# Patient Record
Sex: Female | Born: 1960 | Race: White | Hispanic: No | Marital: Married | Smoking: Former smoker
Health system: Southern US, Community
[De-identification: ages and names within clinical notes are randomized; demographics above are authoritative.]

## PROBLEM LIST (undated history)

## (undated) DIAGNOSIS — R42 Dizziness and giddiness: Secondary | ICD-10-CM

## (undated) DIAGNOSIS — R002 Palpitations: Secondary | ICD-10-CM

## (undated) HISTORY — PX: TUBAL LIGATION: SHX77

## (undated) HISTORY — PX: ENDOMETRIAL ABLATION: SHX621

---
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
## 2019-05-03 ENCOUNTER — Ambulatory Visit (INDEPENDENT_AMBULATORY_CARE_PROVIDER_SITE_OTHER): Payer: BC Managed Care – PPO | Admitting: Cardiology

## 2019-05-03 ENCOUNTER — Other Ambulatory Visit: Payer: Self-pay

## 2019-05-03 VITALS — BP 142/84 | HR 77 | Ht 62.0 in | Wt 164.0 lb

## 2019-05-03 DIAGNOSIS — R011 Cardiac murmur, unspecified: Secondary | ICD-10-CM | POA: Insufficient documentation

## 2019-05-03 DIAGNOSIS — R002 Palpitations: Secondary | ICD-10-CM | POA: Insufficient documentation

## 2019-05-03 DIAGNOSIS — R072 Precordial pain: Secondary | ICD-10-CM

## 2019-05-03 DIAGNOSIS — R202 Paresthesia of skin: Secondary | ICD-10-CM | POA: Insufficient documentation

## 2019-05-03 DIAGNOSIS — R0789 Other chest pain: Secondary | ICD-10-CM

## 2019-05-03 DIAGNOSIS — Z01812 Encounter for preprocedural laboratory examination: Secondary | ICD-10-CM | POA: Insufficient documentation

## 2019-05-03 DIAGNOSIS — R42 Dizziness and giddiness: Secondary | ICD-10-CM | POA: Insufficient documentation

## 2019-05-03 DIAGNOSIS — R079 Chest pain, unspecified: Secondary | ICD-10-CM

## 2019-05-03 HISTORY — DX: Palpitations: R00.2

## 2019-05-03 HISTORY — DX: Dizziness and giddiness: R42

## 2019-05-03 MED ORDER — NITROGLYCERIN 0.4 MG SL SUBL: 0.4000 mg | SUBLINGUAL_TABLET | SUBLINGUAL | 5 refills | Status: DC | PRN

## 2019-05-03 MED ORDER — METOPROLOL TARTRATE 50 MG PO TABS: ORAL_TABLET | ORAL | 0 refills | Status: DC

## 2019-05-03 NOTE — Progress Notes (Signed)
Cardiology Office Note:    Date:  05/03/2019   ID:  Alexis Roy, DOB 05-Jan-1961, MRN [id number]  PCP:  Enid Skeens., MD  Cardiologist:  No primary care provider on file.  Electrophysiologist:  None   Referring MD: Enid Skeens., MD   The patient was referred by her primary care physician for chest tightness and palpitations.  ASSESSMENT:    1. Chest pain of uncertain etiology   2. Murmur   3. Precordial pain   4. Palpitations   5. Pre-procedure lab exam    PLAN:    1.  Although her chest pain is sounds atypical but she has risk factors therefore making her intermediate risk patient for coronary artery disease.  I am going to pursue an ischemic evaluation with a CTA of the coronaries.  The patient is not allergic to contrast dye and she is willing to proceed with this diagnostic testing. All of her questions were answered during this time to her satisfaction. Sublingual nitroglycerin prescription was sent, its protocol and 911 protocol explained and the patient vocalized understanding questions were answered to the patient's satisfaction.  2.  Zio patch for 3 days will be ordered for patient for me to be able to assess any heart rate scratching or rhythm changes.  3.  Transthoracic echocardiogram has been ordered to assess  RV/LV function, structural abnormalities.  4.  Her blood pressure is slightly elevated in office today with systolic [id number] she denies any history of hypertension states that whenever her blood pressure is taking is usually around 110.  I have advised patient to decrease salt intake in her diet as well as we are going to reassess need for starting antihypertensives if this continues to increase.  5.  Lab work from her PCP office shows recent lipid profile- this was reviewed with the patient.   The patient is in agreement with the above plan. The patient left the office in stable condition.  The patient will follow up in 2 months.   Medication  Adjustments/Labs and Tests Ordered: Current medicines are reviewed at length with the patient today.  Concerns regarding medicines are outlined above.  Orders Placed This Encounter  Procedures  . CT CORONARY FRACTIONAL FLOW RESERVE DATA PREP  . CT CORONARY FRACTIONAL FLOW RESERVE FLUID ANALYSIS  . CT CORONARY MORPH W/CTA COR W/SCORE W/CA W/CM &/OR WO/CM  . Basic Metabolic Panel (BMET)  . LONG TERM MONITOR (3-14 DAYS)  . EKG 12-Lead  . ECHOCARDIOGRAM COMPLETE   Meds ordered this encounter  Medications  . nitroGLYCERIN (NITROSTAT) 0.4 MG SL tablet    Sig: Place 1 tablet (0.4 mg total) under the tongue every 5 (five) minutes as needed for chest pain.    Dispense:  25 tablet    Refill:  5  . metoprolol tartrate (LOPRESSOR) 50 MG tablet    Sig: Take 2 tabs(100 mg) 2 hours prior to CT    Dispense:  2 tablet    Refill:  0    History of Present Illness:    Alexis Roy is a 58 y.o. female with a hx of significant family history of premature coronary artery disease.  The patient Roy to be evaluated because she is followed by several months of chest tightness.  She describes this sensation at the upper portion her chest with tightness that goes to her shoulder into her neck.  She notes that usually last for about a few minutes with spontaneous resolution.  She admits to a brief period  of shortness of breath.   In addition she tells me she has been experiencing intermittent palpitation.  She describes this as a rapid onset which last sometimes for 2 minutes and have abrupt offset.  She states that this can happen anytime during the day or night without any ramp-up.  She notes that recently she noticed that if she is experiencing this and cough this helps relieve her as the rapid heart rate stops.  She admits to some lightheadedness during these episodes.  She is not experiencing any chest pain at this time.  But notes that she had the episode of palpitations prior to presenting.  Past  Medical History:  Diagnosis Date  . Dizziness 05/03/2019  . Palpitations 05/03/2019    Past Surgical History:  Procedure Laterality Date  . ENDOMETRIAL ABLATION    . TUBAL LIGATION      Current Medications: Current Meds  Medication Sig  . ibuprofen (ADVIL) 200 MG tablet Take 200 mg by mouth every 6 (six) hours as needed.     Allergies:   Patient has no known allergies.   Social History   Socioeconomic History  . Marital status: Married    Spouse name: Not on file  . Number of children: Not on file  . Years of education: Not on file  . Highest education level: Not on file  Occupational History  . Not on file  Social Needs  . Financial resource strain: Not on file  . Food insecurity    Worry: Not on file    Inability: Not on file  . Transportation needs    Medical: Not on file    Non-medical: Not on file  Tobacco Use  . Smoking status: Former Smoker    Packs/day: 0.50    Years: 10.00    Pack years: 5.00    Types: Cigarettes    Quit date: 1990    Years since quitting: 30.7  . Smokeless tobacco: Never Used  Substance and Sexual Activity  . Alcohol use: Never    Frequency: Never  . Drug use: Never  . Sexual activity: Not on file  Lifestyle  . Physical activity    Days per week: Not on file    Minutes per session: Not on file  . Stress: Not on file  Relationships  . Social Herbalist on phone: Not on file    Gets together: Not on file    Attends religious service: Not on file    Active member of club or organization: Not on file    Attends meetings of clubs or organizations: Not on file    Relationship status: Not on file  Other Topics Concern  . Not on file  Social History Narrative  . Not on file      Family History: The patient's family history includes CAD in her father and mother; Heart attack in her brother and brother; Hypertension in her sister and sister; Stroke in her brother.    ROS:   Review of Systems  Constitution: Negative  for decreased appetite, fever and weight gain.  HENT: Negative for congestion, ear discharge, hoarse voice and sore throat.   Eyes: Negative for discharge, redness, vision loss in right eye and visual halos.  Cardiovascular: Negative for chest pain, dyspnea on exertion, leg swelling, orthopnea and palpitations.  Respiratory: Negative for cough, hemoptysis, shortness of breath and snoring.   Endocrine: Negative for heat intolerance and polyphagia.  Hematologic/Lymphatic: Negative for bleeding problem. Does not bruise/bleed  easily.  Skin: Negative for flushing, nail changes, rash and suspicious lesions.  Musculoskeletal: Negative for arthritis, joint pain, muscle cramps, myalgias, neck pain and stiffness.  Gastrointestinal: Negative for abdominal pain, bowel incontinence, diarrhea and excessive appetite.  Genitourinary: Negative for decreased libido, genital sores and incomplete emptying.  Neurological: Negative for brief paralysis, focal weakness, headaches and loss of balance.  Psychiatric/Behavioral: Negative for altered mental status, depression and suicidal ideas.  Allergic/Immunologic: Negative for HIV exposure and persistent infections.    EKGs/Labs/Other Studies Reviewed:    The following studies were reviewed today:   EKG:  The ekg ordered today demonstrates sinus rhythm, heart rate 77 bpm nonspecific ST changes.  Recent Labs: No results found for requested labs within last 8760 hours.  Recent Lipid Panel No results found for: CHOL, TRIG, HDL, CHOLHDL, VLDL, LDLCALC, LDLDIRECT  Physical Exam:    VS:  BP (!) 142/84 (BP Location: Right Arm, Patient Position: Sitting, Cuff Size: Normal)   Pulse 77   Ht 5\' 2"  (1.575 m)   Wt 164 lb (74.4 kg)   SpO2 98%   BMI 30.00 kg/m     Wt Readings from Last 3 Encounters:  05/03/19 164 lb (74.4 kg)     GEN: Well nourished, well developed in no acute distress HEENT: Normal NECK: No JVD; No carotid bruits LYMPHATICS: No lymphadenopathy  CARDIAC: S1S2 noted,RRR, 2/6 holosystolic murmurs, rubs, gallops RESPIRATORY:  Clear to auscultation without rales, wheezing or rhonchi  ABDOMEN: Soft, non-tender, non-distended, +bowel sounds, no guarding. EXTREMITIES: No edema, No cyanosis, no clubbing MUSCULOSKELETAL:  No edema; No deformity  SKIN: Warm and dry NEUROLOGIC:  Alert and oriented x 3, non-focal PSYCHIATRIC:  Normal affect, good insight   Patient Instructions  Medication Instructions:  Your physician has recommended you make the following change in your medication:   Nitroglycerin 0.4 mg sublingual (under your tongue) as needed for chest pain. If experiencing chest pain, stop what you are doing and sit down. Take 1 nitroglycerin and wait 5 minutes. If chest pain continues, take another nitroglycerin and wait 5 minutes. If chest pain does not subside, take 1 more nitroglycerin and dial 911. You make take a total of 3 nitroglycerin in a 15 minute time frame.    If you need a refill on your cardiac medications before your next appointment, please call your pharmacy.   Lab work: Your physician recommends that you return for lab work in:   3-7 days prior to CT:BMP  If you have labs (blood work) drawn today and your tests are completely normal, you will receive your results only by: Marland Kitchen MyChart Message (if you have MyChart) OR . A paper copy in the mail If you have any lab test that is abnormal or we need to change your treatment, we will call you to review the results.  Testing/Procedures: Your physician has requested that you have an echocardiogram. Echocardiography is a painless test that uses sound waves to create images of your heart. It provides your doctor with information about the size and shape of your heart and how well your heart's chambers and valves are working. This procedure takes approximately one hour. There are no restrictions for this procedure.  Your physician has recommended that you wear a ZIO monitor.  ZIO monitors are medical devices that record the heart's electrical activity. Doctors most often use these monitors to diagnose arrhythmias. Arrhythmias are problems with the speed or rhythm of the heartbeat. The monitor is a small, portable device. You can wear  one while you do your normal daily activities. This is usually used to diagnose what is causing palpitations/syncope (passing out).  Wear 3 days   Your physician has requested that you have cardiac CT. Cardiac computed tomography (CT) is a painless test that uses an x-ray machine to take clear, detailed pictures of your heart. For further information please visit [email or web address]. Please follow instruction sheet as given.  Your cardiac CT will be scheduled at one of the below locations:   [facility] [address] [place], [place] [address] [phone]  If scheduled at [facility], please arrive at the [facility] main entrance of [facility] 30-45 minutes prior to test start time. Proceed to the [facility] Radiology Department (first floor) to check-in and test prep.  Please follow these instructions carefully (unless otherwise directed):  On the Night Before the Test: . Be sure to Drink plenty of water. . Do not consume any caffeinated/decaffeinated beverages or chocolate 12 hours prior to your test. . Do not take any antihistamines 12 hours prior to your test.  On the Day of the Test: . Drink plenty of water. Do not drink any water within one hour of the test. . Do not eat any food 4 hours prior to the test. . You may take your regular medications prior to the test.  . Take metoprolol (Lopressor) two hours prior to test. . FEMALES- please wear underwire-free bra if available                 -If HR is less than 55 BPM- No Beta Blocker                -IF HR is greater than 55 BPM and patient is less than or equal to 18 yrs old Lopressor 100mg  x1. .     After the Test: . Drink plenty of  water. . After receiving IV contrast, you may experience a mild flushed feeling. This is normal. . On occasion, you may experience a mild rash up to 24 hours after the test. This is not dangerous. If this occurs, you can take Benadryl 25 mg and increase your fluid intake. . If you experience trouble breathing, this can be serious. If it is severe call 911 IMMEDIATELY. If it is mild, please call our office.   Please contact the cardiac imaging nurse navigator should you have any questions/concerns Marchia Bond, RN Navigator Cardiac Imaging [facility] [phone] Office  [phone] Cell   Follow-Up: At [facility], you and your health needs are our priority.  As part of our continuing mission to provide you with exceptional heart care, we have created designated Provider Care Teams.  These Care Teams include your primary Cardiologist (physician) and Advanced Practice Providers (APPs -  Physician Assistants and Nurse Practitioners) who all work together to provide you with the care you need, when you need it. You will need a follow up appointment in 2 months with Dr Harriet Masson Any Other Special Instructions Will Be Listed Below (If Applicable).   Cardiac CT Angiogram  A cardiac CT angiogram is a procedure to look at the heart and the area around the heart. It may be done to help find the cause of chest pains or other symptoms of heart disease. During this procedure, a large X-ray machine, called a CT scanner, takes detailed pictures of the heart and the surrounding area after a dye (contrast material) has been injected into blood vessels  in the area. The procedure is also sometimes called a coronary CT angiogram, coronary artery scanning, or CTA. A cardiac CT angiogram allows the health care provider to see how well blood is flowing to and from the heart. The health care provider will be able to see if there are any problems, such as:  Blockage or narrowing of the  coronary arteries in the heart.  Fluid around the heart.  Signs of weakness or disease in the muscles, valves, and tissues of the heart. Tell a health care provider about:  Any allergies you have. This is especially important if you have had a previous allergic reaction to contrast dye.  All medicines you are taking, including vitamins, herbs, eye drops, creams, and over-the-counter medicines.  Any blood disorders you have.  Any surgeries you have had.  Any medical conditions you have.  Whether you are pregnant or may be pregnant.  Any anxiety disorders, chronic pain, or other conditions you have that may increase your stress or prevent you from lying still. What are the risks? Generally, this is a safe procedure. However, problems may occur, including:  Bleeding.  Infection.  Allergic reactions to medicines or dyes.  Damage to other structures or organs.  Kidney damage from the dye or contrast that is used.  Increased risk of cancer from radiation exposure. This risk is low. Talk with your health care provider about: ? The risks and benefits of testing. ? How you can receive the lowest dose of radiation. What happens before the procedure?  Wear comfortable clothing and remove any jewelry, glasses, dentures, and hearing aids.  Follow instructions from your health care provider about eating and drinking. This may include: ? For 12 hours before the test - avoid caffeine. This includes tea, coffee, soda, energy drinks, and diet pills. Drink plenty of water or other fluids that do not have caffeine in them. Being well-hydrated can prevent complications. ? For 4-6 hours before the test - stop eating and drinking. The contrast dye can cause nausea, but this is less likely if your stomach is empty.  Ask your health care provider about changing or stopping your regular medicines. This is especially important if you are taking diabetes medicines, blood thinners, or medicines to  treat erectile dysfunction. What happens during the procedure?  Hair on your chest may need to be removed so that small sticky patches called electrodes can be placed on your chest. These will transmit information that helps to monitor your heart during the test.  An IV tube will be inserted into one of your veins.  You might be given a medicine to control your heart rate during the test. This will help to ensure that good images are obtained.  You will be asked to lie on an exam table. This table will slide in and out of the CT machine during the procedure.  Contrast dye will be injected into the IV tube. You might feel warm, or you may get a metallic taste in your mouth.  You will be given a medicine (nitroglycerin) to relax (dilate) the arteries in your heart.  The table that you are lying on will move into the CT machine tunnel for the scan.  The person running the machine will give you instructions while the scans are being done. You may be asked to: ? Keep your arms above your head. ? Hold your breath. ? Stay very still, even if the table is moving.  When the scanning is complete, you will  be moved out of the machine.  The IV tube will be removed. The procedure may vary among health care providers and hospitals. What happens after the procedure?  You might feel warm, or you may get a metallic taste in your mouth from the contrast dye.  You may have a headache from the nitroglycerin.  After the procedure, drink water or other fluids to wash (flush) the contrast material out of your body.  Contact a health care provider if you have any symptoms of allergy to the contrast. These symptoms include: ? Shortness of breath. ? Rash or hives. ? A racing heartbeat.  Most people can return to their normal activities right after the procedure. Ask your health care provider what activities are safe for you.  It is up to you to get the results of your procedure. Ask your health care  provider, or the department that is doing the procedure, when your results will be ready. Summary  A cardiac CT angiogram is a procedure to look at the heart and the area around the heart. It may be done to help find the cause of chest pains or other symptoms of heart disease.  During this procedure, a large X-ray machine, called a CT scanner, takes detailed pictures of the heart and the surrounding area after a dye (contrast material) has been injected into blood vessels in the area.  Ask your health care provider about changing or stopping your regular medicines before the procedure. This is especially important if you are taking diabetes medicines, blood thinners, or medicines to treat erectile dysfunction.  After the procedure, drink water or other fluids to wash (flush) the contrast material out of your body. This information is not intended to replace advice given to you by your health care provider. Make sure you discuss any questions you have with your health care provider. Document Released: 07/09/2008 Document Revised: 07/09/2017 Document Reviewed: 06/15/2016 Elsevier Patient Education  [employer].  Echocardiogram An echocardiogram is a procedure that uses painless sound waves (ultrasound) to produce an image of the heart. Images from an echocardiogram can provide important information about:  Signs of coronary artery disease (CAD).  Aneurysm detection. An aneurysm is a weak or damaged part of an artery wall that bulges out from the normal force of blood pumping through the body.  Heart size and shape. Changes in the size or shape of the heart can be associated with certain conditions, including heart failure, aneurysm, and CAD.  Heart muscle function.  Heart valve function.  Signs of a past heart attack.  Fluid buildup around the heart.  Thickening of the heart muscle.  A tumor or infectious growth around the heart valves. Tell a health care provider about:  Any  allergies you have.  All medicines you are taking, including vitamins, herbs, eye drops, creams, and over-the-counter medicines.  Any blood disorders you have.  Any surgeries you have had.  Any medical conditions you have.  Whether you are pregnant or may be pregnant. What are the risks? Generally, this is a safe procedure. However, problems may occur, including:  Allergic reaction to dye (contrast) that may be used during the procedure. What happens before the procedure? No specific preparation is needed. You may eat and drink normally. What happens during the procedure?   An IV tube may be inserted into one of your veins.  You may receive contrast through this tube. A contrast is an injection that improves the quality of the pictures from your  heart.  A gel will be applied to your chest.  A wand-like tool (transducer) will be moved over your chest. The gel will help to transmit the sound waves from the transducer.  The sound waves will harmlessly bounce off of your heart to allow the heart images to be captured in real-time motion. The images will be recorded on a computer. The procedure may vary among health care providers and hospitals. What happens after the procedure?  You may return to your normal, everyday life, including diet, activities, and medicines, unless your health care provider tells you not to do that. Summary  An echocardiogram is a procedure that uses painless sound waves (ultrasound) to produce an image of the heart.  Images from an echocardiogram can provide important information about the size and shape of your heart, heart muscle function, heart valve function, and fluid buildup around your heart.  You do not need to do anything to prepare before this procedure. You may eat and drink normally.  After the echocardiogram is completed, you may return to your normal, everyday life, unless your health care provider tells you not to do that. This  information is not intended to replace advice given to you by your health care provider. Make sure you discuss any questions you have with your health care provider. Document Released: 07/24/2000 Document Revised: 11/17/2018 Document Reviewed: 08/29/2016 Elsevier Patient Education  2020 [employer].       Adopting a Healthy Lifestyle.  Know what a healthy weight is for you (roughly BMI <25) and aim to maintain this   Aim for 7+ servings of fruits and vegetables daily   65-80+ fluid ounces of water or unsweet tea for healthy kidneys   Limit to max 1 drink of alcohol per day; avoid smoking/tobacco   Limit animal fats in diet for cholesterol and heart health - choose grass fed whenever available   Avoid highly processed foods, and foods high in saturated/trans fats   Aim for low stress - take time to unwind and care for your mental health   Aim for 150 min of moderate intensity exercise weekly for heart health, and weights twice weekly for bone health   Aim for 7-9 hours of sleep daily   When it Roy to diets, agreement about the perfect plan isnt easy to find, even among the experts. Experts at the [employer] developed an idea known as the Healthy Eating Plate. Just imagine a plate divided into logical, healthy portions.   The emphasis is on diet quality:   Load up on vegetables and fruits - one-half of your plate: Aim for color and variety, and remember that potatoes dont count.   Go for whole grains - one-quarter of your plate: Whole wheat, barley, wheat berries, quinoa, oats, brown rice, and foods made with them. If you want pasta, go with whole wheat pasta.   Protein power - one-quarter of your plate: Fish, chicken, beans, and nuts are all healthy, versatile protein sources. Limit red meat.   The diet, however, does go beyond the plate, offering a few other suggestions.   Use healthy plant oils, such as olive, canola, soy, corn, sunflower and  peanut. Check the labels, and avoid partially hydrogenated oil, which have unhealthy trans fats.   If youre thirsty, drink water. Coffee and tea are good in moderation, but skip sugary drinks and limit milk and dairy products to one or two daily servings.   The type of carbohydrate in the  diet is more important than the amount. Some sources of carbohydrates, such as vegetables, fruits, whole grains, and beans-are healthier than others.   Finally, stay active  Signed, Berniece Salines, DO  05/03/2019 4:03 PM    [facility]

## 2019-05-03 NOTE — Patient Instructions (Addendum)
Medication Instructions:  Your physician has recommended you make the following change in your medication:   Nitroglycerin 0.4 mg sublingual (under your tongue) as needed for chest pain. If experiencing chest pain, stop what you are doing and sit down. Take 1 nitroglycerin and wait 5 minutes. If chest pain continues, take another nitroglycerin and wait 5 minutes. If chest pain does not subside, take 1 more nitroglycerin and dial 911. You make take a total of 3 nitroglycerin in a 15 minute time frame.    If you need a refill on your cardiac medications before your next appointment, please call your pharmacy.   Lab work: Your physician recommends that you return for lab work in:   3-7 days prior to CT:BMP  If you have labs (blood work) drawn today and your tests are completely normal, you will receive your results only by:  [place] (if you have MyChart) OR  A paper copy in the mail If you have any lab test that is abnormal or we need to change your treatment, we will call you to review the results.  Testing/Procedures: Your physician has requested that you have an echocardiogram. Echocardiography is a painless test that uses sound waves to create images of your heart. It provides your doctor with information about the size and shape of your heart and how well your hearts chambers and valves are working. This procedure takes approximately one hour. There are no restrictions for this procedure.  Your physician has recommended that you wear a ZIO monitor. ZIO monitors are medical devices that record the hearts electrical activity. Doctors most often use these monitors to diagnose arrhythmias. Arrhythmias are problems with the speed or rhythm of the heartbeat. The monitor is a small, portable device. You can wear one while you do your normal daily activities. This is usually used to diagnose what is causing palpitations/syncope (passing out).  Wear 3 days   Your physician has  requested that you have cardiac CT. Cardiac computed tomography (CT) is a painless test that uses an x-ray machine to take clear, detailed pictures of your heart. For further information please visit [email or web address]. Please follow instruction sheet as given.  Your cardiac CT will be scheduled at one of the below locations:   [facility] [id number] [place], [place] [address] [phone]  If scheduled at [facility], please arrive at the [facility] main entrance of [facility] 30-45 minutes prior to test start time. Proceed to the [facility] Radiology Department (first floor) to check-in and test prep.  Please follow these instructions carefully (unless otherwise directed):  On the Night Before the Test:  Be sure to Drink plenty of water.  Do not consume any caffeinated/decaffeinated beverages or chocolate 12 hours prior to your test.  Do not take any antihistamines 12 hours prior to your test.  On the Day of the Test:  Drink plenty of water. Do not drink any water within one hour of the test.  Do not eat any food 4 hours prior to the test.  You may take your regular medications prior to the test.   Take metoprolol (Lopressor) two hours prior to test.  FEMALES- please wear underwire-free bra if available                 -If HR is less than 55 BPM- No Beta Blocker                -IF HR is greater than  55 BPM and patient is less than or equal to 30 yrs old Lopressor 100mg  x1. .     After the Test:  Drink plenty of water.  After receiving IV contrast, you may experience a mild flushed feeling. This is normal.  On occasion, you may experience a mild rash up to 24 hours after the test. This is not dangerous. If this occurs, you can take Benadryl 25 mg and increase your fluid intake.  If you experience trouble breathing, this can be serious. If it is severe call 911 IMMEDIATELY. If it is mild, please call our office.   Please contact the  cardiac imaging nurse navigator should you have any questions/concerns Marchia Bond, RN Navigator Cardiac Imaging [facility] [phone] Office  [phone] Cell   Follow-Up: At [facility], you and your health needs are our priority.  As part of our continuing mission to provide you with exceptional heart care, we have created designated Provider Care Teams.  These Care Teams include your primary Cardiologist (physician) and Advanced Practice Providers (APPs -  Physician Assistants and Nurse Practitioners) who all work together to provide you with the care you need, when you need it. You will need a follow up appointment in 2 months with Dr Harriet Masson Any Other Special Instructions Will Be Listed Below (If Applicable).   Cardiac CT Angiogram  A cardiac CT angiogram is a procedure to look at the heart and the area around the heart. It may be done to help find the cause of chest pains or other symptoms of heart disease. During this procedure, a large X-ray machine, called a CT scanner, takes detailed pictures of the heart and the surrounding area after a dye (contrast material) has been injected into blood vessels in the area. The procedure is also sometimes called a coronary CT angiogram, coronary artery scanning, or CTA. A cardiac CT angiogram allows the health care provider to see how well blood is flowing to and from the heart. The health care provider will be able to see if there are any problems, such as:  Blockage or narrowing of the coronary arteries in the heart.  Fluid around the heart.  Signs of weakness or disease in the muscles, valves, and tissues of the heart. Tell a health care provider about:  Any allergies you have. This is especially important if you have had a previous allergic reaction to contrast dye.  All medicines you are taking, including vitamins, herbs, eye drops, creams, and over-the-counter medicines.  Any blood disorders you  have.  Any surgeries you have had.  Any medical conditions you have.  Whether you are pregnant or may be pregnant.  Any anxiety disorders, chronic pain, or other conditions you have that may increase your stress or prevent you from lying still. What are the risks? Generally, this is a safe procedure. However, problems may occur, including:  Bleeding.  Infection.  Allergic reactions to medicines or dyes.  Damage to other structures or organs.  Kidney damage from the dye or contrast that is used.  Increased risk of cancer from radiation exposure. This risk is low. Talk with your health care provider about: ? The risks and benefits of testing. ? How you can receive the lowest dose of radiation. What happens before the procedure?  Wear comfortable clothing and remove any jewelry, glasses, dentures, and hearing aids.  Follow instructions from your health care provider about eating and drinking. This may include: ? For 12 hours before the  test -- avoid caffeine. This includes tea, coffee, soda, energy drinks, and diet pills. Drink plenty of water or other fluids that do not have caffeine in them. Being well-hydrated can prevent complications. ? For 4-6 hours before the test -- stop eating and drinking. The contrast dye can cause nausea, but this is less likely if your stomach is empty.  Ask your health care provider about changing or stopping your regular medicines. This is especially important if you are taking diabetes medicines, blood thinners, or medicines to treat erectile dysfunction. What happens during the procedure?  Hair on your chest may need to be removed so that small sticky patches called electrodes can be placed on your chest. These will transmit information that helps to monitor your heart during the test.  An IV tube will be inserted into one of your veins.  You might be given a medicine to control your heart rate during the test. This will help to ensure that good  images are obtained.  You will be asked to lie on an exam table. This table will slide in and out of the CT machine during the procedure.  Contrast dye will be injected into the IV tube. You might feel warm, or you may get a metallic taste in your mouth.  You will be given a medicine (nitroglycerin) to relax (dilate) the arteries in your heart.  The table that you are lying on will move into the CT machine tunnel for the scan.  The person running the machine will give you instructions while the scans are being done. You may be asked to: ? Keep your arms above your head. ? Hold your breath. ? Stay very still, even if the table is moving.  When the scanning is complete, you will be moved out of the machine.  The IV tube will be removed. The procedure may vary among health care providers and hospitals. What happens after the procedure?  You might feel warm, or you may get a metallic taste in your mouth from the contrast dye.  You may have a headache from the nitroglycerin.  After the procedure, drink water or other fluids to wash (flush) the contrast material out of your body.  Contact a health care provider if you have any symptoms of allergy to the contrast. These symptoms include: ? Shortness of breath. ? Rash or hives. ? A racing heartbeat.  Most people can return to their normal activities right after the procedure. Ask your health care provider what activities are safe for you.  It is up to you to get the results of your procedure. Ask your health care provider, or the department that is doing the procedure, when your results will be ready. Summary  A cardiac CT angiogram is a procedure to look at the heart and the area around the heart. It may be done to help find the cause of chest pains or other symptoms of heart disease.  During this procedure, a large X-ray machine, called a CT scanner, takes detailed pictures of the heart and the surrounding area after a dye (contrast  material) has been injected into blood vessels in the area.  Ask your health care provider about changing or stopping your regular medicines before the procedure. This is especially important if you are taking diabetes medicines, blood thinners, or medicines to treat erectile dysfunction.  After the procedure, drink water or other fluids to wash (flush) the contrast material out of your body. This information is not intended to  replace advice given to you by your health care provider. Make sure you discuss any questions you have with your health care provider. Document Released: 07/09/2008 Document Revised: 07/09/2017 Document Reviewed: 06/15/2016 Elsevier Patient Education  [place].  Echocardiogram An echocardiogram is a procedure that uses painless sound waves (ultrasound) to produce an image of the heart. Images from an echocardiogram can provide important information about:  Signs of coronary artery disease (CAD).  Aneurysm detection. An aneurysm is a weak or damaged part of an artery wall that bulges out from the normal force of blood pumping through the body.  Heart size and shape. Changes in the size or shape of the heart can be associated with certain conditions, including heart failure, aneurysm, and CAD.  Heart muscle function.  Heart valve function.  Signs of a past heart attack.  Fluid buildup around the heart.  Thickening of the heart muscle.  A tumor or infectious growth around the heart valves. Tell a health care provider about:  Any allergies you have.  All medicines you are taking, including vitamins, herbs, eye drops, creams, and over-the-counter medicines.  Any blood disorders you have.  Any surgeries you have had.  Any medical conditions you have.  Whether you are pregnant or may be pregnant. What are the risks? Generally, this is a safe procedure. However, problems may occur, including:  Allergic reaction to dye (contrast) that may be used  during the procedure. What happens before the procedure? No specific preparation is needed. You may eat and drink normally. What happens during the procedure?   An IV tube may be inserted into one of your veins.  You may receive contrast through this tube. A contrast is an injection that improves the quality of the pictures from your heart.  A gel will be applied to your chest.  A wand-like tool (transducer) will be moved over your chest. The gel will help to transmit the sound waves from the transducer.  The sound waves will harmlessly bounce off of your heart to allow the heart images to be captured in real-time motion. The images will be recorded on a computer. The procedure may vary among health care providers and hospitals. What happens after the procedure?  You may return to your normal, everyday life, including diet, activities, and medicines, unless your health care provider tells you not to do that. Summary  An echocardiogram is a procedure that uses painless sound waves (ultrasound) to produce an image of the heart.  Images from an echocardiogram can provide important information about the size and shape of your heart, heart muscle function, heart valve function, and fluid buildup around your heart.  You do not need to do anything to prepare before this procedure. You may eat and drink normally.  After the echocardiogram is completed, you may return to your normal, everyday life, unless your health care provider tells you not to do that. This information is not intended to replace advice given to you by your health care provider. Make sure you discuss any questions you have with your health care provider. Document Released: 07/24/2000 Document Revised: 11/17/2018 Document Reviewed: 08/29/2016 Elsevier Patient Education  2020 [employer].

## 2019-05-08 ENCOUNTER — Ambulatory Visit (INDEPENDENT_AMBULATORY_CARE_PROVIDER_SITE_OTHER): Payer: BC Managed Care – PPO

## 2019-05-08 DIAGNOSIS — R002 Palpitations: Secondary | ICD-10-CM | POA: Diagnosis not present

## 2019-06-05 ENCOUNTER — Other Ambulatory Visit: Payer: Self-pay

## 2019-06-05 ENCOUNTER — Ambulatory Visit (INDEPENDENT_AMBULATORY_CARE_PROVIDER_SITE_OTHER): Payer: BC Managed Care – PPO

## 2019-06-05 DIAGNOSIS — R011 Cardiac murmur, unspecified: Secondary | ICD-10-CM | POA: Diagnosis not present

## 2019-06-05 DIAGNOSIS — R072 Precordial pain: Secondary | ICD-10-CM | POA: Diagnosis not present

## 2019-06-05 DIAGNOSIS — R002 Palpitations: Secondary | ICD-10-CM

## 2019-06-05 NOTE — Progress Notes (Signed)
Complete echocardiogram has been performed.  Jimmy Novah Goza RDCS, RVT 

## 2019-06-06 ENCOUNTER — Telehealth (HOSPITAL_COMMUNITY): Payer: Self-pay | Admitting: Emergency Medicine

## 2019-06-06 LAB — BASIC METABOLIC PANEL
BUN/Creatinine Ratio: 20 (ref 12–28)
BUN: 14 mg/dL (ref 8–27)
CO2: 26 mmol/L (ref 20–29)
Calcium: 9.2 mg/dL (ref 8.7–10.3)
Chloride: 102 mmol/L (ref 96–106)
Creatinine, Ser: 0.69 mg/dL (ref 0.57–1.00)
GFR calc Af Amer: 109 mL/min/{1.73_m2} (ref >59)
GFR calc non Af Amer: 95 mL/min/{1.73_m2} (ref >59)
Glucose: 94 mg/dL (ref 65–99)
Potassium: 4.1 mmol/L (ref 3.5–5.2)
Sodium: 138 mmol/L (ref 134–144)

## 2019-06-06 NOTE — Telephone Encounter (Signed)
Left message on voicemail with name and callback number Stclair Szymborski RN Navigator Cardiac Imaging [facility] [phone] Office [phone] Cell  

## 2019-06-08 ENCOUNTER — Encounter (HOSPITAL_COMMUNITY): Payer: Self-pay

## 2019-06-08 ENCOUNTER — Ambulatory Visit (HOSPITAL_COMMUNITY)
Admission: RE | Admit: 2019-06-08 | Discharge: 2019-06-08 | Disposition: A | Discharge status: Home / Self Care | Payer: BC Managed Care – PPO | Source: Ambulatory Visit | Attending: Cardiology | Admitting: Cardiology

## 2019-06-08 ENCOUNTER — Other Ambulatory Visit: Payer: Self-pay

## 2019-06-08 DIAGNOSIS — R072 Precordial pain: Secondary | ICD-10-CM

## 2019-06-08 MED ORDER — METOPROLOL TARTRATE 5 MG/5ML IV SOLN
5.0000 mg | INTRAVENOUS | Status: DC | PRN
  Administered 2019-06-08 (×3): 5 mg via INTRAVENOUS

## 2019-06-08 MED ORDER — NITROGLYCERIN 0.4 MG SL SUBL
0.8000 mg | SUBLINGUAL_TABLET | Freq: Once | SUBLINGUAL | Status: AC
  Administered 2019-06-08: 12:00:00 0.8 mg via SUBLINGUAL

## 2019-06-08 MED ORDER — IOHEXOL 350 MG/ML SOLN
100.0000 mL | Freq: Once | INTRAVENOUS | Status: AC | PRN
  Administered 2019-06-08: 100 mL via INTRAVENOUS

## 2019-06-08 MED ORDER — METOPROLOL TARTRATE 5 MG/5ML IV SOLN
INTRAVENOUS | Status: AC
  Administered 2019-06-08: 5 mg via INTRAVENOUS
  Filled 2019-06-08: qty 10

## 2019-06-08 MED ORDER — NITROGLYCERIN 0.4 MG SL SUBL
SUBLINGUAL_TABLET | SUBLINGUAL | Status: AC
  Administered 2019-06-08: 0.8 mg via SUBLINGUAL
  Filled 2019-06-08: qty 2

## 2019-06-08 MED ORDER — METOPROLOL TARTRATE 5 MG/5ML IV SOLN
INTRAVENOUS | Status: AC
  Filled 2019-06-08: qty 5

## 2019-06-14 ENCOUNTER — Encounter: Payer: Self-pay | Admitting: Cardiology

## 2019-06-14 ENCOUNTER — Ambulatory Visit (INDEPENDENT_AMBULATORY_CARE_PROVIDER_SITE_OTHER): Payer: BC Managed Care – PPO | Admitting: Cardiology

## 2019-06-14 ENCOUNTER — Other Ambulatory Visit: Payer: Self-pay

## 2019-06-14 VITALS — BP 142/78 | HR 83 | Ht 62.0 in | Wt 166.0 lb

## 2019-06-14 DIAGNOSIS — I472 Ventricular tachycardia: Secondary | ICD-10-CM | POA: Diagnosis not present

## 2019-06-14 DIAGNOSIS — I471 Supraventricular tachycardia: Secondary | ICD-10-CM

## 2019-06-14 DIAGNOSIS — I10 Essential (primary) hypertension: Secondary | ICD-10-CM

## 2019-06-14 DIAGNOSIS — I4729 Other ventricular tachycardia: Secondary | ICD-10-CM

## 2019-06-14 DIAGNOSIS — R0789 Other chest pain: Secondary | ICD-10-CM | POA: Diagnosis not present

## 2019-06-14 DIAGNOSIS — E669 Obesity, unspecified: Secondary | ICD-10-CM

## 2019-06-14 MED ORDER — METOPROLOL SUCCINATE ER 25 MG PO TB24: 25.0000 mg | ORAL_TABLET | Freq: Every day | ORAL | 1 refills | Status: DC

## 2019-06-14 NOTE — Progress Notes (Signed)
Cardiology Office Note:    Date:  06/14/2019   ID:  Alexis Roy, DOB [birth year], MRN [id number]  PCP:  Enid Skeens., MD  Cardiologist:  No primary care provider on file.  Electrophysiologist:  None   Referring MD: Enid Skeens., MD   Chief Complaint  Patient presents with  . Follow-up    History of Present Illness:    Alexis Roy is a 58 y.o. female with a hx of with no medical history presented with palpitations and chest pain.  The patient was seen on April 30, 2019 and at the end of her visit a CTA of the coronaries were recommended, Zio patch due to her palpitations as well as transthoracic echocardiogram.  In the interim the patient was able to get this testing done.  She is here to discuss results.  Today she tells me she still experiencing intermittent chest pain but not improved, and had not had any palpitations since her last visit.  No other complaints at this time.  Past Medical History:  Diagnosis Date  . Dizziness 05/03/2019  . Palpitations 05/03/2019    Past Surgical History:  Procedure Laterality Date  . ENDOMETRIAL ABLATION    . TUBAL LIGATION      Current Medications: Current Meds  Medication Sig  . amoxicillin-clavulanate (AUGMENTIN) 875-125 MG tablet Take 1 tablet by mouth 2 (two) times daily.  Marland Kitchen ibuprofen (ADVIL) 200 MG tablet Take 200 mg by mouth every 6 (six) hours as needed.  . metoprolol tartrate (LOPRESSOR) 50 MG tablet Take 2 tabs(100 mg) 2 hours prior to CT  . nitroGLYCERIN (NITROSTAT) 0.4 MG SL tablet Place 1 tablet (0.4 mg total) under the tongue every 5 (five) minutes as needed for chest pain.     Allergies:   Patient has no known allergies.   Social History   Socioeconomic History  . Marital status: Married    Spouse name: Not on file  . Number of children: Not on file  . Years of education: Not on file  . Highest education level: Not on file  Occupational History  . Not on file  Social Needs  . Financial  resource strain: Not on file  . Food insecurity    Worry: Not on file    Inability: Not on file  . Transportation needs    Medical: Not on file    Non-medical: Not on file  Tobacco Use  . Smoking status: Former Smoker    Packs/day: 0.50    Years: 10.00    Pack years: 5.00    Types: Cigarettes    Quit date: 1990    Years since quitting: 30.8  . Smokeless tobacco: Never Used  Substance and Sexual Activity  . Alcohol use: Never    Frequency: Never  . Drug use: Never  . Sexual activity: Not on file  Lifestyle  . Physical activity    Days per week: Not on file    Minutes per session: Not on file  . Stress: Not on file  Relationships  . Social Herbalist on phone: Not on file    Gets together: Not on file    Attends religious service: Not on file    Active member of club or organization: Not on file    Attends meetings of clubs or organizations: Not on file    Relationship status: Not on file  Other Topics Concern  . Not on file  Social History Narrative  . Not on file  Family History: The patient's family history includes CAD in her father and mother; Heart attack in her brother and brother; Hypertension in her sister and sister; Stroke in her brother.  ROS:   Review of Systems  Constitution: Negative for decreased appetite, fever and weight gain.  HENT: Negative for congestion, ear discharge, hoarse voice and sore throat.   Eyes: Negative for discharge, redness, vision loss in right eye and visual halos.  Cardiovascular: Negative for chest pain, dyspnea on exertion, leg swelling, orthopnea and palpitations.  Respiratory: Negative for cough, hemoptysis, shortness of breath and snoring.   Endocrine: Negative for heat intolerance and polyphagia.  Hematologic/Lymphatic: Negative for bleeding problem. Does not bruise/bleed easily.  Skin: Negative for flushing, nail changes, rash and suspicious lesions.  Musculoskeletal: Negative for arthritis, joint pain,  muscle cramps, myalgias, neck pain and stiffness.  Gastrointestinal: Negative for abdominal pain, bowel incontinence, diarrhea and excessive appetite.  Genitourinary: Negative for decreased libido, genital sores and incomplete emptying.  Neurological: Negative for brief paralysis, focal weakness, headaches and loss of balance.  Psychiatric/Behavioral: Negative for altered mental status, depression and suicidal ideas.  Allergic/Immunologic: Negative for HIV exposure and persistent infections.    EKGs/Labs/Other Studies Reviewed:    The following studies were reviewed today:   EKG: None today.  Transthoracic echocardiogram IMPRESSIONS   1. Left ventricular ejection fraction, by visual estimation, is 60 to 65%. The left ventricle has normal function. Normal left ventricular size. There is no left ventricular hypertrophy.  2. Global right ventricle has normal systolic function.The right ventricular size is normal. No increase in right ventricular wall thickness.  3. Left atrial size was normal.  4. Right atrial size was normal.  5. The mitral valve is normal in structure. Trace mitral valve regurgitation. No evidence of mitral stenosis.  6. The tricuspid valve is normal in structure. Tricuspid valve regurgitation was not visualized by color flow Doppler.  7. The aortic valve is normal in structure. Aortic valve regurgitation is mild by color flow Doppler. Structurally normal aortic valve, with no evidence of sclerosis or stenosis.  8. The pulmonic valve was normal in structure. Pulmonic valve regurgitation is not visualized by color flow Doppler.  9. Normal pulmonary artery systolic pressure. 10. The inferior vena cava is normal in size with greater than 50% respiratory variability, suggesting right atrial pressure of 3 mmHg.  Zio Monitor 05/25/2019 The patient wore the monitor for 3 days. Indications: Palpitations Minimum HR of 65 bpm, with maximum HR of 169 bpm, and average HR of 89  bpm. Predominant underlying rhythm was Sinus Rhythm.  2 Ventricular Tachycardia runs occurred, the run with the fastest interval lasting 4 beats with a max rate of 154 bpm (avg 137 bpm); the run with the fastest interval was also the longest.  4 Supraventricular Tachycardia runs occurred, the run with the fastest interval lasting 19 beats with a max rate of 169 bpm (avg 160 bpm); the run with the Fastest interval was also the longest. Premature atrial complexes were rare (<1.0%). Premature ventricular complexes were rare (<1.0% ,226). Ventricular Bigeminy was present. No AV blocks, No pauses, No atrial fibrillation. Patient triggered events were associated with ventricular ectopy and sinus rhythm. Conclusion: This study is remarkable for:                           I.  2 runs of Nonsustained Ventricular tachycardia.  II. 4 total episodes of supraventricular tachycardia (differential diagnosis: Atrioventricular nodal reentry tachycardia versus Atrial tachycardia).  CTA coronaries IMPRESSION: 06/08/2019 1. Coronary calcium score of 0. This was 0 percentile for age and sex matched control. 2. Normal coronary origin with right dominance. 3. No evidence of CAD.  CAD RADS 0. 4. Consider evaluation for non cardiac sources of chest pain.  Recent Labs: 06/05/2019: BUN 14; Creatinine, Ser 0.69; Potassium 4.1; Sodium 138  Recent Lipid Panel No results found for: CHOL, TRIG, HDL, CHOLHDL, VLDL, LDLCALC, LDLDIRECT  Physical Exam:    VS:  BP (!) 142/78 (BP Location: Right Arm, Patient Position: Sitting, Cuff Size: Normal)   Pulse 83   Ht 5\' 2"  (1.575 m)   Wt 166 lb (75.3 kg)   SpO2 99%   BMI 30.36 kg/m     Wt Readings from Last 3 Encounters:  06/14/19 166 lb (75.3 kg)  05/03/19 164 lb (74.4 kg)     GEN: Well nourished, well developed in no acute distress HEENT: Normal NECK: No JVD; No carotid bruits LYMPHATICS: No lymphadenopathy CARDIAC: S1S2 noted,RRR, no  murmurs, rubs, gallops RESPIRATORY:  Clear to auscultation without rales, wheezing or rhonchi  ABDOMEN: Soft, non-tender, non-distended, +bowel sounds, no guarding. EXTREMITIES: No edema, No cyanosis, no clubbing MUSCULOSKELETAL:  No edema; No deformity  SKIN: Warm and dry NEUROLOGIC:  Alert and oriented x 3, non-focal PSYCHIATRIC:  Normal affect, good insight  ASSESSMENT:    1. NSVT (nonsustained ventricular tachycardia) (HCC)   2. Paroxysmal atrial tachycardia (HCC)   3. Chest tightness   4. Essential hypertension    PLAN:    1. NSVT -CTA did not show any evidence of coronary disease therefore this is not secondary to ischemic etiology.  For now are going to start her on a beta-blocker Toprol-XL 25 mg daily.  We will continue to monitor patient on this as she continue to have recurrent episodes of sustained ventricular tachycardia again I would refer her to the ED for this study.  Thankfully she is not having any lightheadedness or dizziness any syncope episodes.  2.  Paroxysmal atrial tachycardia-beta-blocker will be started as stated above.  3.  Chest tightness-thankfully this is not secondary to coronary artery disease.  I am hoping that this will resolve once we can control her arrhythmia.  In addition of asked patient to talk to her primary care doctor to rule out other causes of chest pain.  4.  Hypertension-she has been started on Toprol-XL 5 mg daily for her nonsustained ventricular tachycardia and high atrial rate.  I am hoping that this medication helps her blood pressure.  If this does not she is aware that we may need another medication added to her regimen.   5.  Obesity-the patient understands the need to lose weight with diet and exercise. We have discussed specific strategies for this.   The patient is in agreement with the above plan. The patient left the office in stable condition.  The patient will follow up in 3 months or sooner if needed   Medication  Adjustments/Labs and Tests Ordered: Current medicines are reviewed at length with the patient today.  Concerns regarding medicines are outlined above.  No orders of the defined types were placed in this encounter.  Meds ordered this encounter  Medications  . metoprolol succinate (TOPROL XL) 25 MG 24 hr tablet    Sig: Take 1 tablet (25 mg total) by mouth daily.    Dispense:  90 tablet  Refill:  1    Patient Instructions  Medication Instructions:  Your physician has recommended you make the following change in your medication:   START: Toprol XL 25 mg daily  *If you need a refill on your cardiac medications before your next appointment, please call your pharmacy*  Lab Work: None If you have labs (blood work) drawn today and your tests are completely normal, you will receive your results only by: Marland Kitchen MyChart Message (if you have MyChart) OR . A paper copy in the mail If you have any lab test that is abnormal or we need to change your treatment, we will call you to review the results.  Testing/Procedures: None  Follow-Up: At [facility], you and your health needs are our priority.  As part of our continuing mission to provide you with exceptional heart care, we have created designated Provider Care Teams.  These Care Teams include your primary Cardiologist (physician) and Advanced Practice Providers (APPs -  Physician Assistants and Nurse Practitioners) who all work together to provide you with the care you need, when you need it.  Your next appointment:   3 months  The format for your next appointment:   In Person  Provider:   Berniece Salines, DO  Other Instructions Metoprolol extended-release tablets What is this medicine? METOPROLOL (me TOE proe lole) is a beta-blocker. Beta-blockers reduce the workload on the heart and help it to beat more regularly. This medicine is used to treat high blood pressure and to prevent chest pain. It is also used to after a heart attack and to  prevent an additional heart attack from occurring. This medicine may be used for other purposes; ask your health care provider or pharmacist if you have questions. COMMON BRAND NAME(S): toprol, Toprol XL What should I tell my health care provider before I take this medicine? They need to know if you have any of these conditions:  diabetes  heart or vessel disease like slow heart rate, worsening heart failure, heart block, sick sinus syndrome or Raynaud's disease  kidney disease  liver disease  lung or breathing disease, like asthma or emphysema  pheochromocytoma  thyroid disease  an unusual or allergic reaction to metoprolol, other beta-blockers, medicines, foods, dyes, or preservatives  pregnant or trying to get pregnant  breast-feeding How should I use this medicine? Take this medicine by mouth with a glass of water. Follow the directions on the prescription label. Do not crush or chew. Take this medicine with or immediately after meals. Take your doses at regular intervals. Do not take more medicine than directed. Do not stop taking this medicine suddenly. This could lead to serious heart-related effects. Talk to your pediatrician regarding the use of this medicine in children. While this drug may be prescribed for children as young as 6 years for selected conditions, precautions do apply. Overdosage: If you think you have taken too much of this medicine contact a poison control center or emergency room at once. NOTE: This medicine is only for you. Do not share this medicine with others. What if I miss a dose? If you miss a dose, take it as soon as you can. If it is almost time for your next dose, take only that dose. Do not take double or extra doses. What may interact with this medicine? This medicine may interact with the following medications:  certain medicines for blood pressure, heart disease, irregular heart beat  certain medicines for depression, like monoamine  oxidase (MAO) inhibitors, fluoxetine, or  paroxetine  clonidine  dobutamine  epinephrine  isoproterenol  reserpine This list may not describe all possible interactions. Give your health care provider a list of all the medicines, herbs, non-prescription drugs, or dietary supplements you use. Also tell them if you smoke, drink alcohol, or use illegal drugs. Some items may interact with your medicine. What should I watch for while using this medicine? Visit your doctor or health care professional for regular check ups. Contact your doctor right away if your symptoms worsen. Check your blood pressure and pulse rate regularly. Ask your health care professional what your blood pressure and pulse rate should be, and when you should contact them. You may get drowsy or dizzy. Do not drive, use machinery, or do anything that needs mental alertness until you know how this medicine affects you. Do not sit or stand up quickly, especially if you are an older patient. This reduces the risk of dizzy or fainting spells. Contact your doctor if these symptoms continue. Alcohol may interfere with the effect of this medicine. Avoid alcoholic drinks. This medicine may increase blood sugar. Ask your healthcare provider if changes in diet or medicines are needed if you have diabetes. What side effects may I notice from receiving this medicine? Side effects that you should report to your doctor or health care professional as soon as possible:  allergic reactions like skin rash, itching or hives  cold or numb hands or feet  depression  difficulty breathing  faint  fever with sore throat  irregular heartbeat, chest pain  rapid weight gain   signs and symptoms of high blood sugar such as being more thirsty or hungry or having to urinate more than normal. You may also feel very tired or have blurry vision.  swollen legs or ankles Side effects that usually do not require medical attention (report to your  doctor or health care professional if they continue or are bothersome):  anxiety or nervousness  change in sex drive or performance  dry skin  headache  nightmares or trouble sleeping  short term memory loss  stomach upset or diarrhea This list may not describe all possible side effects. Call your doctor for medical advice about side effects. You may report side effects to FDA at 1-800-FDA-1088. Where should I keep my medicine? Keep out of the reach of children. Store at room temperature between 15 and 30 degrees C (59 and 86 degrees F). Throw away any unused medicine after the expiration date. NOTE: This sheet is a summary. It may not cover all possible information. If you have questions about this medicine, talk to your doctor, pharmacist, or health care provider.  2020 Elsevier/Gold Standard (2018-05-17 11:09:41)      Adopting a Healthy Lifestyle.  Know what a healthy weight is for you (roughly BMI <25) and aim to maintain this   Aim for 7+ servings of fruits and vegetables daily   65-80+ fluid ounces of water or unsweet tea for healthy kidneys   Limit to max 1 drink of alcohol per day; avoid smoking/tobacco   Limit animal fats in diet for cholesterol and heart health - choose grass fed whenever available   Avoid highly processed foods, and foods high in saturated/trans fats   Aim for low stress - take time to unwind and care for your mental health   Aim for 150 min of moderate intensity exercise weekly for heart health, and weights twice weekly for bone health   Aim for 7-9 hours of sleep daily  When it comes to diets, agreement about the perfect plan isnt easy to find, even among the experts. Experts at the [employer] developed an idea known as the Healthy Eating Plate. Just imagine a plate divided into logical, healthy portions.   The emphasis is on diet quality:   Load up on vegetables and fruits - one-half of your plate: Aim for color  and variety, and remember that potatoes dont count.   Go for whole grains - one-quarter of your plate: Whole wheat, barley, wheat berries, quinoa, oats, brown rice, and foods made with them. If you want pasta, go with whole wheat pasta.   Protein power - one-quarter of your plate: Fish, chicken, beans, and nuts are all healthy, versatile protein sources. Limit red meat.   The diet, however, does go beyond the plate, offering a few other suggestions.   Use healthy plant oils, such as olive, canola, soy, corn, sunflower and peanut. Check the labels, and avoid partially hydrogenated oil, which have unhealthy trans fats.   If youre thirsty, drink water. Coffee and tea are good in moderation, but skip sugary drinks and limit milk and dairy products to one or two daily servings.   The type of carbohydrate in the diet is more important than the amount. Some sources of carbohydrates, such as vegetables, fruits, whole grains, and beans-are healthier than others.   Finally, stay active  Signed, Berniece Salines, DO  06/14/2019 12:05 PM    [place]

## 2019-06-14 NOTE — Patient Instructions (Signed)
Medication Instructions:  Your physician has recommended you make the following change in your medication:   START: Toprol XL 25 mg daily  *If you need a refill on your cardiac medications before your next appointment, please call your pharmacy*  Lab Work: None If you have labs (blood work) drawn today and your tests are completely normal, you will receive your results only by: Marland Kitchen MyChart Message (if you have MyChart) OR . A paper copy in the mail If you have any lab test that is abnormal or we need to change your treatment, we will call you to review the results.  Testing/Procedures: None  Follow-Up: At [facility], you and your health needs are our priority.  As part of our continuing mission to provide you with exceptional heart care, we have created designated Provider Care Teams.  These Care Teams include your primary Cardiologist (physician) and Advanced Practice Providers (APPs -  Physician Assistants and Nurse Practitioners) who all work together to provide you with the care you need, when you need it.  Your next appointment:   3 months  The format for your next appointment:   In Person  Provider:   Berniece Salines, DO  Other Instructions Metoprolol extended-release tablets What is this medicine? METOPROLOL (me TOE proe lole) is a beta-blocker. Beta-blockers reduce the workload on the heart and help it to beat more regularly. This medicine is used to treat high blood pressure and to prevent chest pain. It is also used to after a heart attack and to prevent an additional heart attack from occurring. This medicine may be used for other purposes; ask your health care provider or pharmacist if you have questions. COMMON BRAND NAME(S): toprol, Toprol XL What should I tell my health care provider before I take this medicine? They need to know if you have any of these conditions:  diabetes  heart or vessel disease like slow heart rate, worsening heart failure, heart block, sick  sinus syndrome or Raynaud's disease  kidney disease  liver disease  lung or breathing disease, like asthma or emphysema  pheochromocytoma  thyroid disease  an unusual or allergic reaction to metoprolol, other beta-blockers, medicines, foods, dyes, or preservatives  pregnant or trying to get pregnant  breast-feeding How should I use this medicine? Take this medicine by mouth with a glass of water. Follow the directions on the prescription label. Do not crush or chew. Take this medicine with or immediately after meals. Take your doses at regular intervals. Do not take more medicine than directed. Do not stop taking this medicine suddenly. This could lead to serious heart-related effects. Talk to your pediatrician regarding the use of this medicine in children. While this drug may be prescribed for children as young as 6 years for selected conditions, precautions do apply. Overdosage: If you think you have taken too much of this medicine contact a poison control center or emergency room at once. NOTE: This medicine is only for you. Do not share this medicine with others. What if I miss a dose? If you miss a dose, take it as soon as you can. If it is almost time for your next dose, take only that dose. Do not take double or extra doses. What may interact with this medicine? This medicine may interact with the following medications:  certain medicines for blood pressure, heart disease, irregular heart beat  certain medicines for depression, like monoamine oxidase (MAO) inhibitors, fluoxetine, or paroxetine  clonidine  dobutamine  epinephrine  isoproterenol  reserpine This list may not describe all possible interactions. Give your health care provider a list of all the medicines, herbs, non-prescription drugs, or dietary supplements you use. Also tell them if you smoke, drink alcohol, or use illegal drugs. Some items may interact with your medicine. What should I watch for while  using this medicine? Visit your doctor or health care professional for regular check ups. Contact your doctor right away if your symptoms worsen. Check your blood pressure and pulse rate regularly. Ask your health care professional what your blood pressure and pulse rate should be, and when you should contact them. You may get drowsy or dizzy. Do not drive, use machinery, or do anything that needs mental alertness until you know how this medicine affects you. Do not sit or stand up quickly, especially if you are an older patient. This reduces the risk of dizzy or fainting spells. Contact your doctor if these symptoms continue. Alcohol may interfere with the effect of this medicine. Avoid alcoholic drinks. This medicine may increase blood sugar. Ask your healthcare provider if changes in diet or medicines are needed if you have diabetes. What side effects may I notice from receiving this medicine? Side effects that you should report to your doctor or health care professional as soon as possible:  allergic reactions like skin rash, itching or hives  cold or numb hands or feet  depression  difficulty breathing  faint  fever with sore throat  irregular heartbeat, chest pain  rapid weight gain   signs and symptoms of high blood sugar such as being more thirsty or hungry or having to urinate more than normal. You may also feel very tired or have blurry vision.  swollen legs or ankles Side effects that usually do not require medical attention (report to your doctor or health care professional if they continue or are bothersome):  anxiety or nervousness  change in sex drive or performance  dry skin  headache  nightmares or trouble sleeping  short term memory loss  stomach upset or diarrhea This list may not describe all possible side effects. Call your doctor for medical advice about side effects. You may report side effects to FDA at 1-800-FDA-1088. Where should I keep my  medicine? Keep out of the reach of children. Store at room temperature between 15 and 30 degrees C (59 and 86 degrees F). Throw away any unused medicine after the expiration date. NOTE: This sheet is a summary. It may not cover all possible information. If you have questions about this medicine, talk to your doctor, pharmacist, or health care provider.  2020 Elsevier/Gold Standard (2018-05-17 11:09:41)

## 2019-07-04 ENCOUNTER — Ambulatory Visit: Payer: BC Managed Care – PPO | Admitting: Cardiology

## 2021-01-15 IMAGING — CT CT HEART MORP W/ CTA COR W/ SCORE W/ CA W/CM &/OR W/O CM
4 of 7 series · 8 of 20 positions shown, 9 images · non-contrast
Comparison: None.
COMPARISON: None.

Addendum:
EXAM:
OVER-READ INTERPRETATION  CT CHEST

The following report is an over-read performed by radiologist Dr.
Tat Tiger [REDACTED] on 06/08/2019. This
over-read does not include interpretation of cardiac or coronary
anatomy or pathology. The coronary calcium score/coronary CTA
interpretation by the cardiologist is attached.
TECHNIQUE: The patient was scanned on a Phillips Force scanner.

[Series 6: best diast 72 % · axial · 0.39mm/px · z∈[+1165,+1207]mm · 2 of 316 slices shown, 3 images]
[im 106/316  vessel]
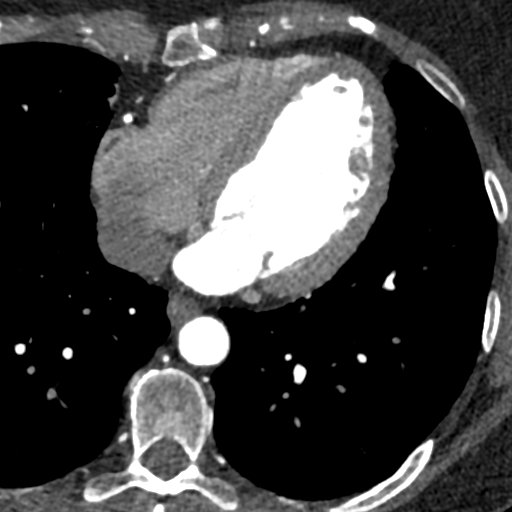
[im 106/316  lung]
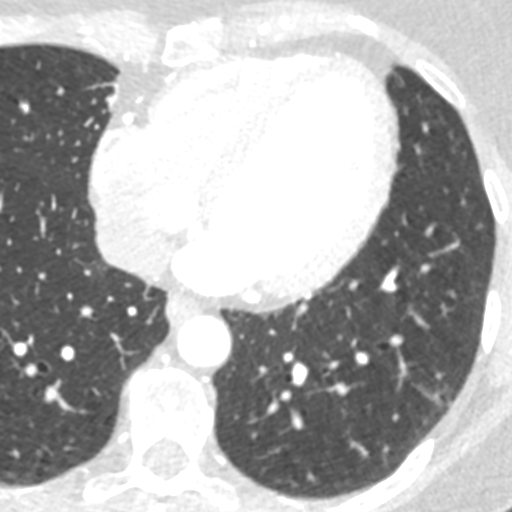
[im 211/316  vessel]
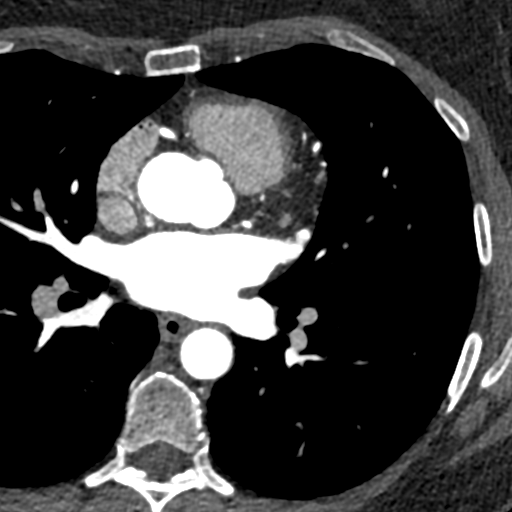

[Series 7: best syst 72 % · axial · 0.39mm/px · z∈[+1165,+1207]mm · 2 of 316 slices shown]
[im 106/316  vessel]
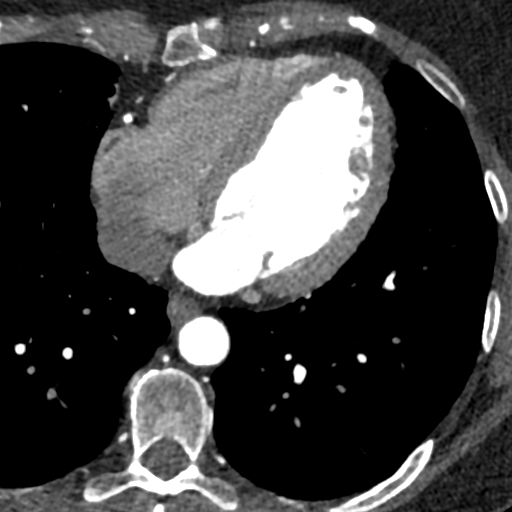
[im 211/316  vessel]
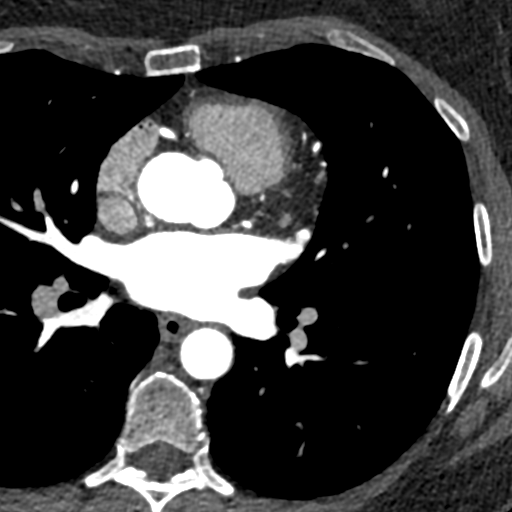

[Series 8: ts diast sharp 72 % · axial · 0.39mm/px · z∈[+1165,+1207]mm · 2 of 316 slices shown]
[im 106/316  lung]
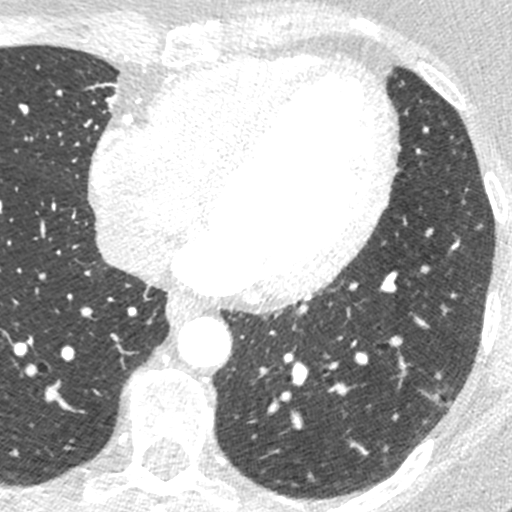
[im 211/316  lung]
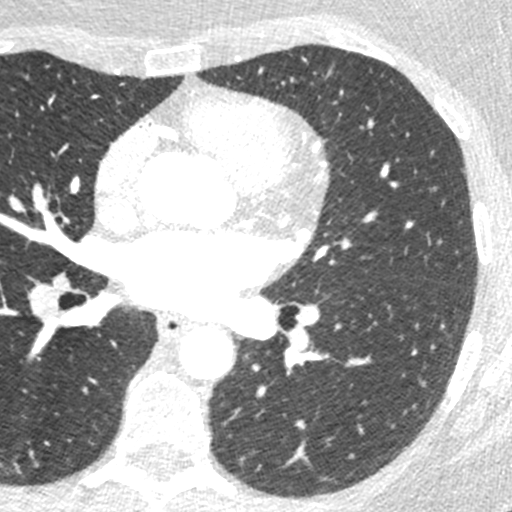

[Series 9: ts syst sharp 72 % · axial · 0.39mm/px · z∈[+1165,+1207]mm · 2 of 316 slices shown]
[im 106/316  lung]
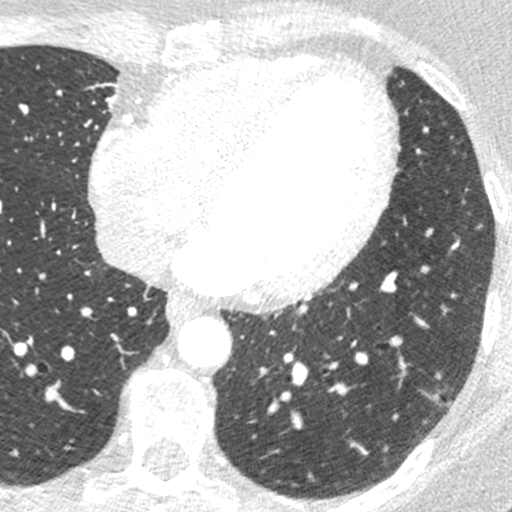
[im 211/316  lung]
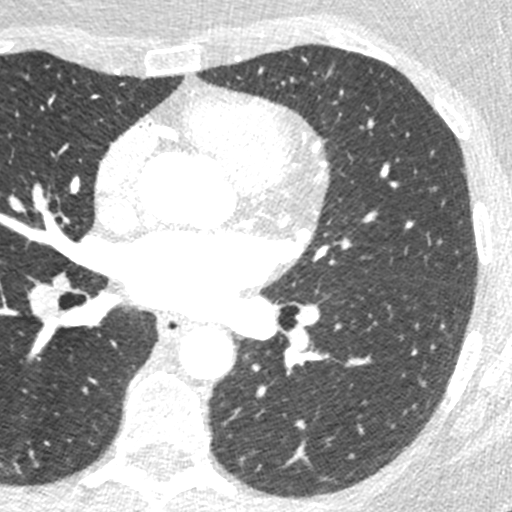

[8 of 20 positions shown; findings below may reference images not displayed]

FINDINGS: Within the visualized portions of the thorax there are no suspicious
appearing pulmonary nodules or masses, there is no acute
consolidative airspace disease, no pleural effusions, no
pneumothorax and no lymphadenopathy. Visualized portions of the
upper abdomen are unremarkable. There are no aggressive appearing
lytic or blastic lesions noted in the visualized portions of the
skeleton.
IMPRESSION: 1. No significant incidental noncardiac findings are noted.

EXAM:
Cardiac/Coronary  CT
FINDINGS: A 120 kV prospective scan was triggered in the descending thoracic
aorta at 111 HU's. Axial non-contrast 3 mm slices were carried out
through the heart. The data set was analyzed on a dedicated work
station and scored using the Agatson method. Gantry rotation speed
was 250 msecs and collimation was .6 mm. No beta blockade and 0.8 mg
of sl NTG was given. The 3D data set was reconstructed in 5%
intervals of the 67-82 % of the R-R cycle. Diastolic phases were
analyzed on a dedicated work station using MPR, MIP and VRT modes.
The patient received 80 cc of contrast.

Aorta: Normal size. Mildly calcified ascending aorta. No dissection.

Aortic Valve:  Trileaflet.  No calcifications.

Coronary Arteries:  Normal coronary origin.  Right dominance.

RCA is a large dominant artery that gives rise to PDA and PLVB.
There is no plaque.

Left main is a large artery that gives rise to LAD and LCX arteries.
There is no plaque.

LAD is a large vessel that gives rise to a moderate sized D1 and
small D2. There is no plaque.

LCX is a non-dominant artery that gives rise to one large OM1
branch. There is no plaque.

Other findings:

Normal pulmonary vein drainage into the left atrium.

Normal let atrial appendage without a thrombus.

Normal size of the pulmonary artery.
IMPRESSION: 1. Coronary calcium score of 0. This was 0 percentile for age and
sex matched control.

2. Normal coronary origin with right dominance.

3. No evidence of CAD.  CAD RADS 0.

4. Consider evaluation for non cardiac sources of chest pain.

Villegas Tores

*** End of Addendum ***
EXAM:
OVER-READ INTERPRETATION  CT CHEST

The following report is an over-read performed by radiologist Dr.
Tat Tiger [REDACTED] on 06/08/2019. This
over-read does not include interpretation of cardiac or coronary
anatomy or pathology. The coronary calcium score/coronary CTA
interpretation by the cardiologist is attached.
FINDINGS: Within the visualized portions of the thorax there are no suspicious
appearing pulmonary nodules or masses, there is no acute
consolidative airspace disease, no pleural effusions, no
pneumothorax and no lymphadenopathy. Visualized portions of the
upper abdomen are unremarkable. There are no aggressive appearing
lytic or blastic lesions noted in the visualized portions of the
skeleton.
IMPRESSION: 1. No significant incidental noncardiac findings are noted.

## 2021-01-27 DIAGNOSIS — D242 Benign neoplasm of left breast: Secondary | ICD-10-CM | POA: Insufficient documentation

## 2021-09-12 ENCOUNTER — Ambulatory Visit: Payer: 59 | Admitting: Sports Medicine

## 2021-09-12 ENCOUNTER — Encounter: Payer: Self-pay | Admitting: Sports Medicine

## 2021-09-12 DIAGNOSIS — B351 Tinea unguium: Secondary | ICD-10-CM

## 2021-09-12 DIAGNOSIS — M79675 Pain in left toe(s): Secondary | ICD-10-CM | POA: Diagnosis not present

## 2021-09-12 NOTE — Progress Notes (Signed)
Subjective: Alexis Roy is a 61 y.o. female patient seen today in office with complaint of painful and thickened left hallux toenail states that it has been sore and tender for the last 6 months hurts with shoes and pressure patient reports that occasionally the toe probably get stepped on but denies any significant redness warmth swelling drainage or any constitutional symptoms at this time.  Patient does report that the nail think it is growing differently and sometimes pinching in on the corners.  No other pedal complaints noted.  Patient Active Problem List   Diagnosis Date Noted   Intraductal papilloma of breast, left 01/27/2021   Dizziness 05/03/2019   Palpitations 05/03/2019   Paresthesias 05/03/2019   Chest pain of uncertain etiology [id number]/46/9629   Pre-procedure lab exam 05/03/2019   Murmur 05/03/2019    Current Outpatient Medications on File Prior to Visit  Medication Sig Dispense Refill   FLUoxetine (PROZAC) 20 MG capsule Take 20 mg by mouth daily.     ibuprofen (ADVIL) 200 MG tablet Take 200 mg by mouth every 6 (six) hours as needed.     No current facility-administered medications on file prior to visit.    No Known Allergies  Objective: Physical Exam  General: Well developed, nourished, no acute distress, awake, alert and oriented x 3  Vascular: Dorsalis pedis artery 2/4 bilateral, Posterior tibial artery 2/4 bilateral, skin temperature warm to warm proximal to distal bilateral lower extremities, no varicosities, pedal hair present bilateral.  Neurological: Gross sensation present via light touch bilateral.   Dermatological: Skin is warm, dry, and supple bilateral, left hallux nail is tender,  thick, and discolored with mild subungal debris, no webspace macerations present bilateral, no open lesions present bilateral, no callus/corns/hyperkeratotic tissue present bilateral. No signs of infection bilateral.  Musculoskeletal: No symptomatic boney deformities noted  bilateral. Muscular strength within normal limits without painon range of motion. No pain with calf compression bilateral.  Assessment and Plan:  Problem List Items Addressed This Visit   None Visit Diagnoses     Dermatophytosis of nail    -  Primary   Relevant Orders   Fungus Culture with Smear   Toe pain, left           -Examined patient -Discussed treatment options for painful dystrophic left hallux nail -\Fungal culture was obtained by removing a portion of the hard nail itself from each of the involved left hallux nail using a sterile nail nipper and sent to [facility] lab. Patient tolerated the biopsy procedure well without discomfort or need for anesthesia.  -Patient to return in 4 weeks for follow up evaluation and discussion of fungal culture results or sooner if symptoms worsen.  Landis Martins, DPM

## 2021-10-15 ENCOUNTER — Ambulatory Visit: Payer: 59 | Admitting: Sports Medicine

## 2021-10-24 ENCOUNTER — Ambulatory Visit: Payer: 59 | Admitting: Sports Medicine

## 2021-11-04 ENCOUNTER — Encounter: Payer: Self-pay | Admitting: Sports Medicine

## 2021-11-04 ENCOUNTER — Other Ambulatory Visit: Payer: Self-pay

## 2021-11-04 ENCOUNTER — Ambulatory Visit: Payer: 59 | Admitting: Sports Medicine

## 2021-11-04 DIAGNOSIS — B351 Tinea unguium: Secondary | ICD-10-CM | POA: Diagnosis not present

## 2021-11-04 DIAGNOSIS — M79675 Pain in left toe(s): Secondary | ICD-10-CM | POA: Diagnosis not present

## 2021-11-04 NOTE — Progress Notes (Signed)
Subjective: ?Altheia Shafran is a 61 y.o. female patient seen today in office for fungal culture results. Patient has no other pedal complaints at this time.  ? ?Patient Active Problem List  ? Diagnosis Date Noted  ? Intraductal papilloma of breast, left 01/27/2021  ? Dizziness 05/03/2019  ? Palpitations 05/03/2019  ? Paresthesias 05/03/2019  ? Chest pain of uncertain etiology 21/22/4825  ? Pre-procedure lab exam 05/03/2019  ? Murmur 05/03/2019  ? ? ?Current Outpatient Medications on File Prior to Visit  ?Medication Sig Dispense Refill  ? FLUoxetine (PROZAC) 20 MG capsule Take 20 mg by mouth daily.    ? ibuprofen (ADVIL) 200 MG tablet Take 200 mg by mouth every 6 (six) hours as needed.    ? ?No current facility-administered medications on file prior to visit.  ? ? ?No Known Allergies ? ?Objective: ?Physical Exam ? ?General: Well developed, nourished, no acute distress, awake, alert and oriented x 3 ? ?Vascular: Dorsalis pedis artery 2/4 bilateral, Posterior tibial artery 2/4 bilateral, skin temperature warm to warm proximal to distal bilateral lower extremities, no varicosities, pedal hair present bilateral. ? ?Neurological: Gross sensation present via light touch bilateral.  ? ?Dermatological: Skin is warm, dry, and supple bilateral, left hallux nail is tender, short thick, and discolored with mild subungal debris, no webspace macerations present bilateral, no open lesions present bilateral, no callus/corns/hyperkeratotic tissue present bilateral. No signs of infection bilateral. ? ?Musculoskeletal: Asymptomatic hallux extensors deformity and slightly noticeable more so on the left compared to the right. ? ?Fungal culture + microtrauma and Trichophyton Metagophytes  ? ?Assessment and Plan:  ?Problem List Items Addressed This Visit   ?None ?Visit Diagnoses   ? ? Dermatophytosis of nail    -  Primary  ? Toe pain, left      ? ?  ? ? ?-Examined patient ?-Discussed treatment options for painful mycotic  nails ?-Complimentary at this visit mechanically debrided and filed with rotary bur left hallux nail to help with pain or thickness at the affected toenail ?-Patient opt for oral Lamisil with full understanding of medication risks; ordered LFTs for review if within normal limits will proceed with sending Rx to pharmacy for lamisil '250mg'$  PO daily. Anticipate 12 week course.  ?-Patient to have her most recent blood work faxed to my office for me to review before seeing Lamisil medication ?-Advised good hygiene habits ?-Patient to return in 6 weeks for follow up evaluation or sooner if symptoms worsen. ? ?Landis Martins, DPM ? ?

## 2021-11-28 ENCOUNTER — Other Ambulatory Visit: Payer: Self-pay | Admitting: Sports Medicine

## 2021-11-28 MED ORDER — TERBINAFINE HCL 250 MG PO TABS: 250.0000 mg | ORAL_TABLET | Freq: Every day | ORAL | 0 refills | Status: AC

## 2021-11-28 NOTE — Progress Notes (Signed)
Blood work reviewed. All results are normal. Sent oral Lamisil to pharmacy ?-Dr. Cannon Kettle ? ?

## 2021-12-16 ENCOUNTER — Ambulatory Visit: Payer: 59 | Admitting: Sports Medicine

## 2022-01-26 ENCOUNTER — Ambulatory Visit: Payer: 59 | Admitting: Podiatry

## 2024-02-06 ENCOUNTER — Ambulatory Visit (HOSPITAL_BASED_OUTPATIENT_CLINIC_OR_DEPARTMENT_OTHER)
Admission: EM | Admit: 2024-02-06 | Discharge: 2024-02-06 | Disposition: A | Discharge status: Home / Self Care | Attending: Family Medicine | Admitting: Family Medicine

## 2024-02-06 ENCOUNTER — Encounter (HOSPITAL_BASED_OUTPATIENT_CLINIC_OR_DEPARTMENT_OTHER): Payer: Self-pay | Admitting: Emergency Medicine

## 2024-02-06 DIAGNOSIS — M545 Low back pain, unspecified: Secondary | ICD-10-CM | POA: Diagnosis not present

## 2024-02-06 DIAGNOSIS — G5701 Lesion of sciatic nerve, right lower limb: Secondary | ICD-10-CM

## 2024-02-06 LAB — POCT URINALYSIS DIP (MANUAL ENTRY)
Bilirubin, UA: NEGATIVE
Blood, UA: NEGATIVE
Glucose, UA: NEGATIVE mg/dL
Ketones, POC UA: NEGATIVE mg/dL
Leukocytes, UA: NEGATIVE
Nitrite, UA: NEGATIVE
Protein Ur, POC: 30 mg/dL — AB
Spec Grav, UA: 1.025 (ref 1.010–1.025)
Urobilinogen, UA: 0.2 U/dL
pH, UA: 7 (ref 5.0–8.0)

## 2024-02-06 MED ORDER — KETOROLAC TROMETHAMINE 60 MG/2ML IM SOLN
60.0000 mg | Freq: Once | INTRAMUSCULAR | Status: AC
  Administered 2024-02-06: 60 mg via INTRAMUSCULAR

## 2024-02-06 MED ORDER — KETOROLAC TROMETHAMINE 10 MG PO TABS: 10.0000 mg | ORAL_TABLET | Freq: Four times a day (QID) | ORAL | 0 refills | Status: AC | PRN

## 2024-02-06 MED ORDER — CYCLOBENZAPRINE HCL 5 MG PO TABS: 5.0000 mg | ORAL_TABLET | Freq: Every evening | ORAL | 0 refills | Status: AC | PRN

## 2024-02-06 NOTE — Discharge Instructions (Addendum)
 Right lower back pain/piriformis syndrome: Ketorolac injection given at the urgent care today.  Ketorolac is a strong NSAID.  Ketorolac 10 mg 1 pill every 6 hours with food if needed for pain.  May take ketorolac pills and drive.  Cyclobenzaprine, 5 mg, 1 pill nightly, as needed for muscle spasms.  Do not use the cyclobenzaprine and drive.  May take both cyclobenzaprine and ketorolac together, if needed.  Work excuse provided.  If back pain persists needs to see family doctor and may need to see orthopedics.  Follow-up as needed.  Discussed shingles as a possible alternative diagnosis.  Advised about shingles and potential rash.  If the rash occurs she needs to come to the urgent care immediately.  She will need to get on medication for shingles.

## 2024-02-06 NOTE — ED Triage Notes (Signed)
 Pt woke up on Wednesday morning with right lower back pain, pt denies any urinary symptoms, pt is unable to sit down due to the pain. Pt has taken ibuprofen and applied heat to area but no relief.

## 2024-02-06 NOTE — ED Provider Notes (Addendum)
 [facility]    CSN: [id number] Arrival date & time: 02/06/24  0818      History   Chief Complaint No chief complaint on file.   HPI Alexis Roy is a 63 y.o. female.   On Wednesday (02/02/24), the patient woke up with acute right lower back pain.  She denies fever, dysuria, urinary frequency/urgency, abdominal pain, nausea, vomiting, diarrhea nor constipation.  She has used Ibuprofen and heating pads without relief.  She does not remember any injury.  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  She has not ever had shingles.  She does not have a rash.  She has not had fever, nausea, vomiting, constipation, diarrhea.  Twice the pain was a 10 out of 10 and that made her feel slightly nauseous just to severe pain.  But she did not vomit.     Past Medical History:  Diagnosis Date   Dizziness 05/03/2019   Palpitations 05/03/2019    Patient Active Problem List   Diagnosis Date Noted   Intraductal papilloma of breast, left 01/27/2021   Dizziness 05/03/2019   Palpitations 05/03/2019   Paresthesias 05/03/2019   Chest pain of uncertain etiology 05/03/2019   Pre-procedure lab exam 05/03/2019   Murmur 05/03/2019    Past Surgical History:  Procedure Laterality Date   ENDOMETRIAL ABLATION     TUBAL LIGATION      OB History   No obstetric history on file.      Home Medications    Prior to Admission medications   Medication Sig Start Date End Date Taking? Authorizing Provider  cyclobenzaprine (FLEXERIL) 5 MG tablet Take 1 tablet (5 mg total) by mouth at bedtime as needed for up to 15 days for muscle spasms. 02/06/24 02/21/24 Yes Ival Domino, FNP  FLUoxetine (PROZAC) 20 MG capsule Take 20 mg by mouth daily. 05/26/21  Yes [provider]  ibuprofen (ADVIL) 200 MG tablet Take 200 mg by mouth every 6 (six) hours as needed.   Yes [provider]  ketorolac (TORADOL) 10 MG tablet Take 1 tablet (10 mg total) by mouth every 6 (six) hours as needed.  02/06/24  Yes Ival Domino, FNP  terbinafine  (LAMISIL ) 250 MG tablet Take 1 tablet (250 mg total) by mouth daily. 11/28/21   Burt Fus, DPM    Family History Family History  Problem Relation Age of Onset   CAD Mother    CAD Father    Hypertension Sister    Heart attack Brother    Stroke Brother    Heart attack Brother        Age 15   Hypertension Sister     Social History Social History   Tobacco Use   Smoking status: Former    Current packs/day: 0.00    Average packs/day: 0.5 packs/day for 10.0 years (5.0 ttl pk-yrs)    Types: Cigarettes    Start date: 23    Quit date: 1990    Years since quitting: 35.5   Smokeless tobacco: Never  Vaping Use   Vaping status: Never Used  Substance Use Topics   Alcohol use: Never   Drug use: Never     Allergies   Patient has no known allergies.   Review of Systems Review of Systems  Constitutional:  Negative for fever.  Respiratory:  Negative for cough.   Cardiovascular:  Negative for chest pain.  Gastrointestinal:  Negative for abdominal pain, constipation, diarrhea, nausea and vomiting.  Genitourinary:  Negative for dysuria, frequency  and urgency.  Musculoskeletal:  Positive for back pain. Negative for arthralgias.  Skin:  Negative for color change and rash.  Neurological:  Negative for syncope.  All other systems reviewed and are negative.    Physical Exam Triage Vital Signs ED Triage Vitals  Encounter Vitals Group     BP 02/06/24 0835 (!) 157/79     Girls Systolic BP Percentile --      Girls Diastolic BP Percentile --      Boys Systolic BP Percentile --      Boys Diastolic BP Percentile --      Pulse Rate 02/06/24 0835 91     Resp 02/06/24 0835 18     Temp 02/06/24 0835 98.1 F (36.7 C)     Temp Source 02/06/24 0835 Oral     SpO2 02/06/24 0835 95 %     Weight --      Height --      Head Circumference --      Peak Flow --      Pain Score 02/06/24 0833 10     Pain Loc --      Pain Education --       Exclude from Growth Chart --    No data found.  Updated Vital Signs BP (!) 157/79 (BP Location: Right Arm)   Pulse 91   Temp 98.1 F (36.7 C) (Oral)   Resp 18   SpO2 95%   Visual Acuity Right Eye Distance:   Left Eye Distance:   Bilateral Distance:    Right Eye Near:   Left Eye Near:    Bilateral Near:     Physical Exam   UC Treatments / Results  Labs (all labs ordered are listed, but only abnormal results are displayed) Labs Reviewed  POCT URINALYSIS DIP (MANUAL ENTRY) - Abnormal; Notable for the following components:      Result Value   Protein Ur, POC =30 (*)    All other components within normal limits    EKG   Radiology No results found.  Procedures Procedures (including critical care time)  Medications Ordered in UC Medications  ketorolac (TORADOL) injection 60 mg (60 mg Intramuscular Given 02/06/24 0924)    Initial Impression / Assessment and Plan / UC Course  I have reviewed the triage vital signs and the nursing notes.  Pertinent labs & imaging results that were available during my care of the patient were reviewed by me and considered in my medical decision making (see chart for details).  Plan of Care: Lower back pain/probable piriformis syndrome: Ketorolac 60 mg IM now.  Ketorolac 10 mg every 6 hours if needed for pain.  Cyclobenzaprine 5 mg nightly as needed for muscle spasm.  See discharge instructions for additional directions to the patient.  Urinalysis was normal or negative.  Urine culture not sent.  Work excuse provided.  Follow-up if needed.  Discussed signs and symptoms of shingles and reasons to return.  Shingles is a differential diagnosis for her current symptoms.  I reviewed the plan of care with the patient and/or the patient's guardian.  The patient and/or guardian had time to ask questions and acknowledged that the questions were answered.  I provided instruction on symptoms or reasons to return here or to go to an ER, if  symptoms/condition did not improve, worsened or if new symptoms occurred.  Final Clinical Impressions(s) / UC Diagnoses   Final diagnoses:  Acute right-sided low back pain without sciatica  Piriformis syndrome of right  side     Discharge Instructions      Right lower back pain/piriformis syndrome: Ketorolac injection given at the urgent care today.  Ketorolac is a strong NSAID.  Ketorolac 10 mg 1 pill every 6 hours with food if needed for pain.  May take ketorolac pills and drive.  Cyclobenzaprine, 5 mg, 1 pill nightly, as needed for muscle spasms.  Do not use the cyclobenzaprine and drive.  May take both cyclobenzaprine and ketorolac together, if needed.  Work excuse provided.  If back pain persists needs to see family doctor and may need to see orthopedics.  Follow-up as needed.  Discussed shingles as a possible alternative diagnosis.  Advised about shingles and potential rash.  If the rash occurs she needs to come to the urgent care immediately.  She will need to get on medication for shingles.     ED Prescriptions     Medication Sig Dispense Auth. Provider   ketorolac (TORADOL) 10 MG tablet Take 1 tablet (10 mg total) by mouth every 6 (six) hours as needed. 20 tablet Jerid Catherman, FNP   cyclobenzaprine (FLEXERIL) 5 MG tablet Take 1 tablet (5 mg total) by mouth at bedtime as needed for up to 15 days for muscle spasms. 15 tablet Lexus Shampine, FNP      PDMP not reviewed this encounter.   Ival Domino, FNP 02/06/24 0947    Ival Domino, FNP 02/06/24 [phone]
